# Patient Record
Sex: Male | Born: 1975 | Race: Black or African American | Hispanic: No | Marital: Married | Smoking: Current some day smoker
Health system: Southern US, Community
[De-identification: ages and names within clinical notes are randomized; demographics above are authoritative.]

## PROBLEM LIST (undated history)

## (undated) DIAGNOSIS — M25473 Effusion, unspecified ankle: Secondary | ICD-10-CM

## (undated) DIAGNOSIS — K219 Gastro-esophageal reflux disease without esophagitis: Secondary | ICD-10-CM

## (undated) DIAGNOSIS — M5441 Lumbago with sciatica, right side: Secondary | ICD-10-CM

## (undated) DIAGNOSIS — F4310 Post-traumatic stress disorder, unspecified: Secondary | ICD-10-CM

## (undated) DIAGNOSIS — G8929 Other chronic pain: Secondary | ICD-10-CM

## (undated) HISTORY — PX: VASECTOMY: SHX75

## (undated) HISTORY — DX: Post-traumatic stress disorder, unspecified: F43.10

## (undated) HISTORY — DX: Lumbago with sciatica, right side: M54.41

## (undated) HISTORY — DX: Gastro-esophageal reflux disease without esophagitis: K21.9

## (undated) HISTORY — DX: Other chronic pain: G89.29

## (undated) HISTORY — DX: Effusion, unspecified ankle: M25.473

---

## 2008-02-07 ENCOUNTER — Ambulatory Visit: Payer: Self-pay | Admitting: Family Medicine

## 2008-10-29 HISTORY — PX: VASECTOMY: SHX75

## 2009-01-10 ENCOUNTER — Ambulatory Visit: Payer: Self-pay | Admitting: Internal Medicine

## 2010-03-01 ENCOUNTER — Ambulatory Visit: Payer: Self-pay | Admitting: Internal Medicine

## 2015-08-08 ENCOUNTER — Ambulatory Visit: Payer: Self-pay | Admitting: Family Medicine

## 2015-08-22 ENCOUNTER — Encounter: Payer: Self-pay | Admitting: Family Medicine

## 2015-08-22 ENCOUNTER — Ambulatory Visit (INDEPENDENT_AMBULATORY_CARE_PROVIDER_SITE_OTHER): Payer: BLUE CROSS/BLUE SHIELD | Admitting: Family Medicine

## 2015-08-22 VITALS — BP 138/88 | HR 106 | Temp 98.2°F | Resp 18 | Ht 72.0 in | Wt 256.6 lb

## 2015-08-22 DIAGNOSIS — L301 Dyshidrosis [pompholyx]: Secondary | ICD-10-CM

## 2015-08-22 MED ORDER — TRIAMCINOLONE ACETONIDE 0.5 % EX OINT: 1.0000 "application " | TOPICAL_OINTMENT | Freq: Two times a day (BID) | CUTANEOUS | Status: DC

## 2015-08-22 NOTE — Progress Notes (Signed)
Name: Dustin RumpleLarry D Karaffa Lawson.   MRN: [id number]    DOB: 10/14/1976   Date:08/22/2015       Progress Note  Subjective  Chief Complaint  Chief Complaint  Patient presents with  . New Patient (Initial Visit)    here to est care   Rash This is a chronic problem. The problem has been waxing and waning since onset. The affected locations include the chest, torso, back and right arm. The rash is characterized by dryness and scaling. He was exposed to nothing. Pertinent negatives include no fever. Past treatments include topical steroids. His past medical history is significant for eczema (hx of eczema diagnosed in 1996).   Past Medical History  Diagnosis Date  . GERD (gastroesophageal reflux disease)   . Post traumatic stress disorder (PTSD)     [facility] ([facility]  . Chronic low back pain with right-sided sciatica   . Ankle swelling     R > L.    Past Surgical History  Procedure Laterality Date  . Vasectomy    . Vasectomy  2010    Family History  Problem Relation Age of Onset  . Hypertension Father   . Healthy Father   . Healthy Mother   . Healthy Sister   . Healthy Brother     Social History   Social History  . Marital Status: Married    Spouse Name: N/A  . Number of Children: N/A  . Years of Education: N/A   Occupational History  . Not on file.   Social History Main Topics  . Smoking status: Current Every Day Smoker    Types: Cigars  . Smokeless tobacco: Not on file  . Alcohol Use: 28.8 oz/week    0 Standard drinks or equivalent, 48 Cans of beer per week  . Drug Use: No  . Sexual Activity: No   Other Topics Concern  . Not on file   Social History Narrative  . No narrative on file    No current outpatient prescriptions on file.  No Known Allergies   Review of Systems  Constitutional: Negative for fever, chills, weight loss and malaise/fatigue.  Skin: Positive for rash.   Objective  Filed Vitals:   08/22/15 1547  BP: 138/88   Pulse: 106  Temp: 98.2 F (36.8 C)  Resp: 18  Height: 6' (1.829 m)  Weight: 256 lb 9 oz (116.376 kg)  SpO2: 95%    Physical Exam  Constitutional: He is well-developed, well-nourished, and in no distress.  Skin: Skin is warm.  Macular, diffuse, pruritic, scaly lesions on the chest, upper back and right arm,  Nursing note and vitals reviewed.    Assessment & Plan  1. Eczema, dyshidrotic  - triamcinolone ointment (KENALOG) 0.5 %; Apply 1 application topically 2 (two) times daily.  Dispense: 45 g; Refill: 0   Isobelle Tuckett Asad A. Faylene KurtzShah [facility] 08/22/2015 4:02 PM

## 2016-02-21 ENCOUNTER — Ambulatory Visit (INDEPENDENT_AMBULATORY_CARE_PROVIDER_SITE_OTHER): Payer: Managed Care, Other (non HMO) | Admitting: Family Medicine

## 2016-02-21 ENCOUNTER — Encounter: Payer: Self-pay | Admitting: Family Medicine

## 2016-02-21 VITALS — BP 132/69 | HR 91 | Temp 98.3°F | Resp 18 | Ht 72.0 in | Wt 252.2 lb

## 2016-02-21 DIAGNOSIS — Z720 Tobacco use: Secondary | ICD-10-CM

## 2016-02-21 DIAGNOSIS — E669 Obesity, unspecified: Secondary | ICD-10-CM | POA: Diagnosis not present

## 2016-02-21 DIAGNOSIS — E66811 Obesity, class 1: Secondary | ICD-10-CM

## 2016-02-21 DIAGNOSIS — L301 Dyshidrosis [pompholyx]: Secondary | ICD-10-CM | POA: Diagnosis not present

## 2016-02-21 MED ORDER — TRIAMCINOLONE ACETONIDE 0.5 % EX OINT: 1.0000 "application " | TOPICAL_OINTMENT | Freq: Two times a day (BID) | CUTANEOUS | Status: DC

## 2016-02-21 NOTE — Progress Notes (Signed)
Name: Dustin RumpleLarry D Tecson Jr.   MRN: [id number]    DOB: 1975/11/23   Date:02/21/2016       Progress Note  Subjective  Chief Complaint  Chief Complaint  Patient presents with  . Follow-up    6 mo Eczema    HPI  Eczema: Pt. Is here for refill of Triamcinolone ointment 0.5% to be applied for rash described as eczema. Rash is usually present over his arms, back, inner thighs. He uses it for a week and it clears up. Has been seen by Dermatology 5-6 years ago and was diagnosed with eczema.   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 Diet consists of mainly grilled, or baked foods (fried foods once a week). Eats a lot of green vegetables, and fruit.   Tobacco Abuse: Pt. Smokes a 1/3PPD, has been smoking for over 15 years. He is ready to quit and has used nicotine patch in the past, but complains that it keeps falling off. [facility] the [facility].    Past Medical History  Diagnosis Date  . GERD (gastroesophageal reflux disease)   . Post traumatic stress disorder (PTSD)     [facility] ([facility])  . Chronic low back pain with right-sided sciatica   . Ankle swelling     R > L.    Past Surgical History  Procedure Laterality Date  . Vasectomy    . Vasectomy  2010    Family History  Problem Relation Age of Onset  . Hypertension Father   . Healthy Father   . Healthy Mother   . Healthy Sister   . Healthy Brother     Social History   Social History  . Marital Status: Married    Spouse Name: N/A  . Number of Children: N/A  . Years of Education: N/A   Occupational History  . Not on file.   Social History Main Topics  . Smoking status: Current Every Day Smoker    Types: Cigars  . Smokeless tobacco: Not on file  . Alcohol Use: 28.8 oz/week    0 Standard drinks or equivalent, 48 Cans of beer per week  . Drug Use: No  . Sexual Activity: No   Other  Topics Concern  . Not on file   Social History Narrative     Current outpatient prescriptions:  .  triamcinolone ointment (KENALOG) 0.5 %, Apply 1 application topically 2 (two) times daily., Disp: 45 g, Rfl: 0  No Known Allergies   Review of Systems  Skin: Positive for itching and rash.    Objective  Filed Vitals:   02/21/16 0817  BP: 132/69  Pulse: 91  Temp: 98.3 F (36.8 C)  TempSrc: Oral  Resp: 18  Height: 6' (1.829 m)  Weight: 252 lb 3.2 oz (114.397 kg)  SpO2: 98%    Physical Exam  Constitutional: He is oriented to person, place, and time and well-developed, well-nourished, and in no distress.  Neurological: He is alert and oriented to person, place, and time.  Skin: Lesion and rash noted. Rash is macular.  Macular scaly pruritic rash over the arms, upper back, and neck.  Nursing note and vitals reviewed.     Assessment & Plan  1. Eczema, dyshidrotic Symptoms suggestive of eczema however, recommended that he consult dermatology for further assessment. Refill for Triamcinolone is sent to pharmacy - triamcinolone ointment (KENALOG)  0.5 %; Apply 1 application topically 2 (two) times daily.  Dispense: 45 g; Refill: 0  2. Obesity (BMI 30.0-34.9) Encouraged patient to continue with dietary and lifestyle therapy, follow-up in 3-6 months. [facility]  3. Tobacco abuse Patient is on nicotine patches recommended by the physician at the [place]. Discussed alternative pharmacotherapy such as Chantix versus Zyban. We'll follow-up in 3-6 months.    Lonzo Saulter Asad A. Faylene Kurtz [facility] 02/21/2016 8:19 AM

## 2016-04-04 ENCOUNTER — Other Ambulatory Visit: Payer: Self-pay | Admitting: Family

## 2016-04-04 DIAGNOSIS — R51 Headache: Principal | ICD-10-CM

## 2016-04-04 DIAGNOSIS — R519 Headache, unspecified: Secondary | ICD-10-CM

## 2016-04-04 DIAGNOSIS — T1590XA Foreign body on external eye, part unspecified, unspecified eye, initial encounter: Secondary | ICD-10-CM

## 2016-04-17 ENCOUNTER — Ambulatory Visit
Admission: RE | Admit: 2016-04-17 | Discharge: 2016-04-17 | Disposition: A | Discharge status: Home / Self Care | Payer: No Typology Code available for payment source | Source: Ambulatory Visit | Attending: Family | Admitting: Family

## 2016-04-17 DIAGNOSIS — T1590XA Foreign body on external eye, part unspecified, unspecified eye, initial encounter: Secondary | ICD-10-CM

## 2016-04-17 DIAGNOSIS — R51 Headache: Principal | ICD-10-CM

## 2016-04-17 DIAGNOSIS — R519 Headache, unspecified: Secondary | ICD-10-CM

## 2016-08-22 ENCOUNTER — Encounter: Payer: Self-pay | Admitting: Family Medicine

## 2016-08-22 ENCOUNTER — Ambulatory Visit (INDEPENDENT_AMBULATORY_CARE_PROVIDER_SITE_OTHER): Payer: Managed Care, Other (non HMO) | Admitting: Family Medicine

## 2016-08-22 VITALS — BP 132/71 | HR 97 | Temp 98.1°F | Resp 17 | Ht 71.0 in | Wt 251.3 lb

## 2016-08-22 DIAGNOSIS — K219 Gastro-esophageal reflux disease without esophagitis: Secondary | ICD-10-CM

## 2016-08-22 DIAGNOSIS — L219 Seborrheic dermatitis, unspecified: Secondary | ICD-10-CM

## 2016-08-22 MED ORDER — OMEPRAZOLE 20 MG PO CPDR: 20.0000 mg | DELAYED_RELEASE_CAPSULE | Freq: Every day | ORAL | 0 refills | Status: DC

## 2016-08-22 MED ORDER — KETOCONAZOLE 2 % EX SHAM: MEDICATED_SHAMPOO | CUTANEOUS | 2 refills | Status: AC

## 2016-08-22 NOTE — Progress Notes (Signed)
Name: Dustin RumpleLarry D Coulston Jr.   MRN: [id number]    DOB: 1976/03/27   Date:08/22/2016       Progress Note  Subjective  Chief Complaint  Chief Complaint  Patient presents with  . Follow-up    6 mo    Gastroesophageal Reflux  He complains of abdominal pain (occasional epigastric abdominal pain), coughing, heartburn and nausea. He reports no sore throat. This is a chronic problem. The problem occurs frequently. The problem has been gradually worsening. The symptoms are aggravated by medications (After [facility], noticed his reflux got worse.). Pertinent negatives include no anemia, fatigue, melena or weight loss. He has tried an antacid and a histamine-2 antagonist for the symptoms. Past procedures do not include an abdominal ultrasound or an EGD.    Pt. Presents for refill on Nizoral shampoo, [facility] for a fungal rash on the scalp and body. Reports having sores and itching on the head and the body from constant itching and scratching.  He is using it three times a day as prescribed by Dermatology and is running out of the shampoo and requesting refill. Rash is improving since he was started on the shampoo. Next appointment with Dermatology is not until 3 months,  records are not available.   Past Medical History:  Diagnosis Date  . Ankle swelling    R > L.  Marland Kitchen. Chronic low back pain with right-sided sciatica   . GERD (gastroesophageal reflux disease)   . Post traumatic stress disorder (PTSD)    [facility] 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 VASECTOMY    . VASECTOMY  2010    Family History  Problem Relation Age of Onset  . Hypertension Father   . Healthy Father   . Healthy Mother   . Healthy Sister   . Healthy Brother     Social History   Social History  . Marital status: Married    Spouse name: N/A  . Number of children: N/A  . Years of education: N/A    Occupational History  . Not on file.   Social History Main Topics  . Smoking status: Current Every Day Smoker    Types: Cigars  . Smokeless tobacco: Never Used  . Alcohol use 28.8 oz/week    48 Cans of beer per week  . Drug use: No  . Sexual activity: No   Other Topics Concern  . Not on file   Social History Narrative  . No narrative on file     Current Outpatient Prescriptions:  .  amitriptyline (ELAVIL) 25 MG tablet, Take 25 mg by mouth at bedtime., Disp: , Rfl:  .  buPROPion (WELLBUTRIN) 100 MG tablet, Take 100 mg by mouth 2 (two) times daily., Disp: , Rfl:  .  fluticasone (FLONASE) 50 MCG/ACT nasal spray, Place into both nostrils daily., Disp: , Rfl:  .  ketoconazole (NIZORAL) 2 % cream, Apply 1 application topically daily., Disp: , Rfl:  .  ketoconazole (NIZORAL) 2 % shampoo, Apply 1 application topically 2 (two) times a week., Disp: , Rfl:  .  loratadine (CLARITIN) 10 MG tablet, Take 10 mg by mouth daily., Disp: , Rfl:  .  Menthol-Methyl Salicylate (THERA-GESIC PLUS) CREA, Apply topically., Disp: , Rfl:  .  naproxen (NAPROSYN) 500 MG tablet, Take 500 mg by mouth 2 (two) times daily with a meal., Disp: , Rfl:  .  omeprazole (PRILOSEC) 20 MG capsule, Take 20 mg by mouth daily., Disp: , Rfl:  .  SUMAtriptan (IMITREX) 50 MG tablet, Take 50 mg by mouth every 2 (two) hours as needed for migraine. May repeat in 2 hours if headache persists or recurs., Disp: , Rfl:  .  terbinafine (LAMISIL) 1 % cream, Apply 1 application topically 2 (two) times daily., Disp: , Rfl:  .  triamcinolone ointment (KENALOG) 0.5 %, Apply 1 application topically 2 (two) times daily., Disp: 45 g, Rfl: 0 .  nicotine (NICODERM CQ - DOSED IN MG/24 HOURS) 14 mg/24hr patch, Place 14 mg onto the skin daily., Disp: , Rfl:   No Known Allergies   Review of Systems  Constitutional: Negative for fatigue and weight loss.  HENT: Negative for sore throat.   Respiratory: Positive for cough.   Gastrointestinal:  Positive for abdominal pain (occasional epigastric abdominal pain), heartburn and nausea. Negative for melena.  Skin: Positive for rash.    Objective  Vitals:   08/22/16 0919  BP: 132/71  Pulse: 97  Resp: 17  Temp: 98.1 F (36.7 C)  TempSrc: Oral  SpO2: 97%  Weight: 251 lb 4.8 oz (114 kg)  Height: 5\' 11"  (1.803 m)    Physical Exam  Constitutional: He is well-developed, well-nourished, and in no distress.  HENT:  Head: Normocephalic and atraumatic.  Cardiovascular: Normal rate, regular rhythm, S1 normal, S2 normal and normal heart sounds.   No murmur heard. Pulmonary/Chest: Effort normal and breath sounds normal. He has no wheezes. He has no rhonchi.  Abdominal: Soft. Bowel sounds are normal. There is no tenderness.  Skin: Skin is warm, dry and intact. No rash noted.  No rash on the upper back or scalp, pt. Reports that rash is clearing up.  Nursing note and vitals reviewed.    Assessment & Plan  1. Seborrheic dermatitis of scalp Prescription for Nizoral, patient will try to bring records from the [place] - ketoconazole (NIZORAL) 2 % shampoo; Use in shower as body wash and on scalp.  Dispense: 120 mL; Refill: 2  2. Gastroesophageal reflux disease, esophagitis presence not specified Symptoms of reflux, start on omeprazole 20 mg, reassess in 3 months - omeprazole (PRILOSEC) 20 MG capsule; Take 1 capsule (20 mg total) by mouth daily.  Dispense: 90 capsule; Refill: 0   Syed Asad A. Faylene Kurtz [facility] 08/22/2016 9:40 AM

## 2016-08-24 ENCOUNTER — Other Ambulatory Visit: Payer: Self-pay | Admitting: Family Medicine

## 2016-08-24 NOTE — Telephone Encounter (Signed)
[facility] [place] [facility] supply refill on Omeprezole 20 mg to be sent in for patient.

## 2016-08-28 NOTE — Telephone Encounter (Signed)
Medication has been refilled and sent to [facility] [place] on 08/22/2016

## 2016-09-26 ENCOUNTER — Encounter: Payer: Managed Care, Other (non HMO) | Admitting: Family Medicine

## 2016-10-04 ENCOUNTER — Encounter: Payer: Self-pay | Admitting: Family Medicine

## 2016-10-04 ENCOUNTER — Ambulatory Visit (INDEPENDENT_AMBULATORY_CARE_PROVIDER_SITE_OTHER): Payer: Managed Care, Other (non HMO) | Admitting: Family Medicine

## 2016-10-04 DIAGNOSIS — Z0000 Encounter for general adult medical examination without abnormal findings: Secondary | ICD-10-CM | POA: Insufficient documentation

## 2016-10-04 LAB — CBC WITH DIFFERENTIAL/PLATELET
BASOS PCT: 0 %
Basophils Absolute: 0 cells/uL (ref 0–200)
EOS ABS: 61 {cells}/uL (ref 15–500)
Eosinophils Relative: 1 %
HEMATOCRIT: 42.6 % (ref 38.5–50.0)
Hemoglobin: 14.1 g/dL (ref 13.2–17.1)
LYMPHS PCT: 43 %
Lymphs Abs: 2623 cells/uL (ref 850–3900)
MCH: 25 pg — ABNORMAL LOW (ref 27.0–33.0)
MCHC: 33.1 g/dL (ref 32.0–36.0)
MCV: 75.7 fL — AB (ref 80.0–100.0)
MONO ABS: 488 {cells}/uL (ref 200–950)
MONOS PCT: 8 %
MPV: 10.8 fL (ref 7.5–12.5)
NEUTROS PCT: 48 %
Neutro Abs: 2928 cells/uL (ref 1500–7800)
PLATELETS: 193 10*3/uL (ref 140–400)
RBC: 5.63 MIL/uL (ref 4.20–5.80)
RDW: 15.2 % — AB (ref 11.0–15.0)
WBC: 6.1 10*3/uL (ref 3.8–10.8)

## 2016-10-04 LAB — COMPLETE METABOLIC PANEL WITH GFR
ALT: 22 U/L (ref 9–46)
AST: 21 U/L (ref 10–40)
Albumin: 4.3 g/dL (ref 3.6–5.1)
Alkaline Phosphatase: 48 U/L (ref 40–115)
BUN: 10 mg/dL (ref 7–25)
CALCIUM: 9.2 mg/dL (ref 8.6–10.3)
CHLORIDE: 106 mmol/L (ref 98–110)
CO2: 28 mmol/L (ref 20–31)
Creat: 1.17 mg/dL (ref 0.60–1.35)
GFR, EST NON AFRICAN AMERICAN: 78 mL/min (ref >=60)
Glucose, Bld: 93 mg/dL (ref 65–99)
POTASSIUM: 4.4 mmol/L (ref 3.5–5.3)
Sodium: 139 mmol/L (ref 135–146)
Total Bilirubin: 0.4 mg/dL (ref 0.2–1.2)
Total Protein: 6.9 g/dL (ref 6.1–8.1)

## 2016-10-04 LAB — LIPID PANEL
CHOL/HDL RATIO: 2.8 ratio (ref <5.0)
CHOLESTEROL: 166 mg/dL (ref <200)
HDL: 60 mg/dL (ref >40)
LDL CALC: 78 mg/dL (ref <100)
TRIGLYCERIDES: 140 mg/dL (ref <150)
VLDL: 28 mg/dL (ref <30)

## 2016-10-04 LAB — TSH: TSH: 0.8 mIU/L (ref 0.40–4.50)

## 2016-10-04 LAB — PSA: PSA: 0.4 ng/mL (ref <=4.0)

## 2016-10-04 NOTE — Progress Notes (Signed)
Name: Dustin RumpleLarry D Tippetts Jr.   MRN: [id number]    DOB: 1975-11-14   Date:10/04/2016       Progress Note  Subjective  Chief Complaint  Chief Complaint  Patient presents with  . Annual Exam    CPE    HPI  Pt. Presents for a Complete Physical Exam. He is doing well.   Past Medical History:  Diagnosis Date  . Ankle swelling    R > L.  Dustin Lawson. Chronic low back pain with right-sided sciatica   . GERD (gastroesophageal reflux disease)   . Post traumatic stress disorder (PTSD)    [facility] 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 VASECTOMY    . VASECTOMY  2010    Family History  Problem Relation Age of Onset  . Hypertension Father   . Healthy Father   . Healthy Mother   . Healthy Sister   . Healthy Brother     Social History   Social History  . Marital status: Married    Spouse name: N/A  . Number of children: N/A  . Years of education: N/A   Occupational History  . Not on file.   Social History Main Topics  . Smoking status: Current Every Day Smoker    Types: Cigars  . Smokeless tobacco: Never Used  . Alcohol use 28.8 oz/week    48 Cans of beer per week  . Drug use: No  . Sexual activity: No   Other Topics Concern  . Not on file   Social History Narrative  . No narrative on file     Current Outpatient Prescriptions:  .  amitriptyline (ELAVIL) 25 MG tablet, Take 25 mg by mouth at bedtime., Disp: , Rfl:  .  buPROPion (WELLBUTRIN) 100 MG tablet, Take 100 mg by mouth 2 (two) times daily., Disp: , Rfl:  .  fluticasone (FLONASE) 50 MCG/ACT nasal spray, Place into both nostrils daily., Disp: , Rfl:  .  ketoconazole (NIZORAL) 2 % cream, Apply 1 application topically daily., Disp: , Rfl:  .  ketoconazole (NIZORAL) 2 % shampoo, Use in shower as body wash and on scalp., Disp: 120 mL, Rfl: 2 .  loratadine (CLARITIN) 10 MG tablet, Take 10 mg by mouth daily., Disp: , Rfl:  .  Menthol-Methyl Salicylate (THERA-GESIC PLUS)  CREA, Apply topically., Disp: , Rfl:  .  naproxen (NAPROSYN) 500 MG tablet, Take 500 mg by mouth 2 (two) times daily with a meal., Disp: , Rfl:  .  omeprazole (PRILOSEC) 20 MG capsule, Take 1 capsule (20 mg total) by mouth daily., Disp: 90 capsule, Rfl: 0 .  SUMAtriptan (IMITREX) 50 MG tablet, Take 50 mg by mouth every 2 (two) hours as needed for migraine. May repeat in 2 hours if headache persists or recurs., Disp: , Rfl:  .  terbinafine (LAMISIL) 1 % cream, Apply 1 application topically 2 (two) times daily., Disp: , Rfl:  .  triamcinolone ointment (KENALOG) 0.5 %, Apply 1 application topically 2 (two) times daily., Disp: 45 g, Rfl: 0 .  nicotine (NICODERM CQ - DOSED IN MG/24 HOURS) 14 mg/24hr patch, Place 14 mg onto the skin daily., Disp: , Rfl:   No Known Allergies   Review of Systems  Constitutional: Negative for chills, fever, malaise/fatigue and weight loss.  HENT: Negative for congestion and sore throat.   Eyes: Negative for blurred vision and double vision.  Respiratory: Negative for cough, sputum production and shortness of  breath.   Cardiovascular: Negative for chest pain and leg swelling.  Gastrointestinal: Negative for abdominal pain, blood in stool, melena, nausea and vomiting.  Genitourinary: Negative for dysuria and hematuria.  Neurological: Positive for headaches (migraine/tension headaches).  Psychiatric/Behavioral: Positive for depression (has been diagnosed with PTSD). The patient is nervous/anxious. The patient does not have insomnia.      Objective  Vitals:   10/04/16 1101  BP: 134/76  Pulse: 97  Resp: 16  Temp: 98.8 F (37.1 C)  TempSrc: Oral  SpO2: 96%  Weight: 249 lb 6.4 oz (113.1 kg)  Height: 5\' 11"  (1.803 m)    Physical Exam  Constitutional: He is oriented to person, place, and time and well-developed, well-nourished, and in no distress.  HENT:  Head: Normocephalic and atraumatic.  Cardiovascular: Normal rate, regular rhythm and normal heart  sounds.   No murmur heard. Pulmonary/Chest: Effort normal and breath sounds normal. He has no wheezes.  Abdominal: Bowel sounds are normal. There is no tenderness.  Neurological: He is alert and oriented to person, place, and time.  Psychiatric: Mood, memory, affect and judgment normal.  Nursing note and vitals reviewed.    Assessment & Plan  1. Annual physical exam  - CBC with Differential - COMPLETE METABOLIC PANEL WITH GFR - Lipid Profile - TSH - Vitamin D (25 hydroxy) - PSA - POCT HgB A1C   Dustin Lawson A. Dustin Lawson [facility] 10/04/2016 11:03 AM

## 2016-10-05 ENCOUNTER — Telehealth: Payer: Self-pay

## 2016-10-05 LAB — POCT GLYCOSYLATED HEMOGLOBIN (HGB A1C): HEMOGLOBIN A1C: 5

## 2016-10-05 LAB — VITAMIN D 25 HYDROXY (VIT D DEFICIENCY, FRACTURES): Vit D, 25-Hydroxy: 10 ng/mL — ABNORMAL LOW (ref 30–100)

## 2016-10-05 MED ORDER — VITAMIN D (ERGOCALCIFEROL) 1.25 MG (50000 UNIT) PO CAPS: 50000.0000 [IU] | ORAL_CAPSULE | ORAL | 0 refills | Status: DC

## 2016-10-05 NOTE — Telephone Encounter (Signed)
Patient has been notified of lab results and a prescription of vitamin D3 50,000 units take 1 capsule once a week x12 weeks has been sent to [facility] [place] per Dr. Sherryll BurgerShah, patient has been notified and verbalized understanding

## 2017-01-17 ENCOUNTER — Other Ambulatory Visit: Payer: Self-pay | Admitting: Family Medicine

## 2017-05-30 IMAGING — CR DG ORBITS FOR FOREIGN BODY
2 series · 2 of 2 positions shown · non-contrast
Comparison: None.

CLINICAL DATA: Metal working/exposure; clearance prior to MRI

EXAM:
ORBITS FOR FOREIGN BODY - 2 VIEW

[t skull ap (1 of 2)]
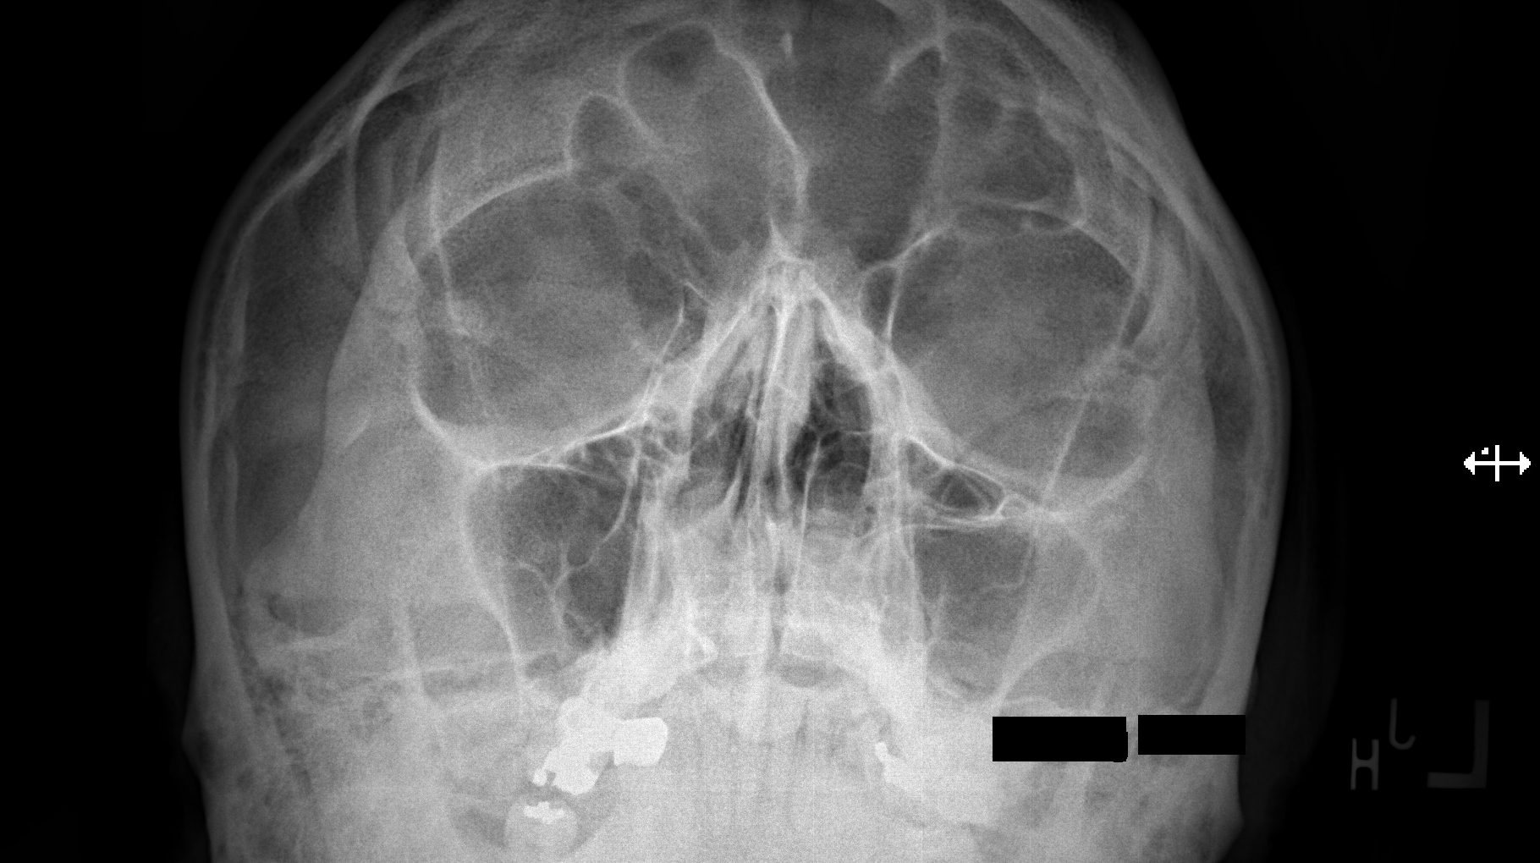

[t skull ap (2 of 2)]
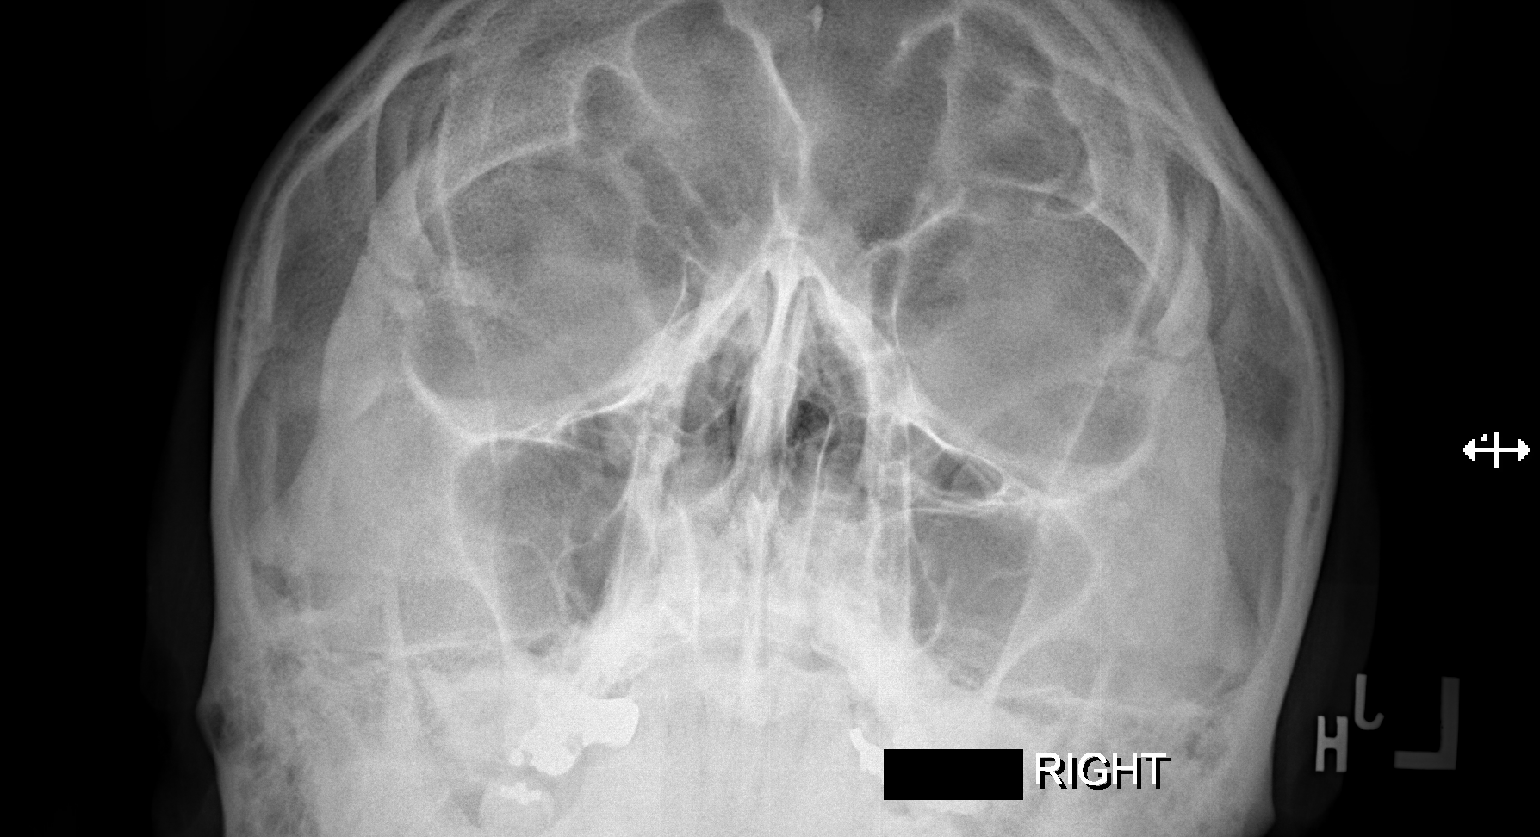

[2 of 2 positions shown; findings below may reference images not displayed]

FINDINGS: There is no evidence of metallic foreign body within the orbits. No
significant bone abnormality identified.
IMPRESSION: No evidence of metallic foreign body within the orbits.

## 2017-10-08 ENCOUNTER — Encounter: Payer: Managed Care, Other (non HMO) | Admitting: Family Medicine

## 2017-11-12 ENCOUNTER — Ambulatory Visit (INDEPENDENT_AMBULATORY_CARE_PROVIDER_SITE_OTHER): Payer: Managed Care, Other (non HMO)

## 2017-11-12 ENCOUNTER — Ambulatory Visit
Admission: EM | Admit: 2017-11-12 | Discharge: 2017-11-12 | Disposition: A | Discharge status: Home / Self Care | Payer: Managed Care, Other (non HMO) | Attending: Family Medicine | Admitting: Family Medicine

## 2017-11-12 ENCOUNTER — Other Ambulatory Visit: Payer: Self-pay

## 2017-11-12 DIAGNOSIS — Z23 Encounter for immunization: Secondary | ICD-10-CM | POA: Diagnosis not present

## 2017-11-12 DIAGNOSIS — S60452A Superficial foreign body of right middle finger, initial encounter: Secondary | ICD-10-CM

## 2017-11-12 MED ORDER — TETANUS-DIPHTH-ACELL PERTUSSIS 5-2.5-18.5 LF-MCG/0.5 IM SUSP
0.5000 mL | Freq: Once | INTRAMUSCULAR | Status: AC
  Administered 2017-11-12: 0.5 mL via INTRAMUSCULAR

## 2017-11-12 NOTE — ED Provider Notes (Signed)
[facility]    CSN: [id number] Arrival date & time: 11/12/17  1635     History   Chief Complaint Chief Complaint  Patient presents with  . Foreign Body in Skin    HPI Dustin RumpleLarry D Norgren Jr. is a 42 y.o. male.   HPI  42 year old male who presents with a foreign body at the tip of his right dominant middle finger.  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  His distal tip.  He pulled something out but feels like there remains a portion of the foreign body in his finger.  Not current on his tetanus toxoid.        Past Medical History:  Diagnosis Date  . Ankle swelling    R > L.  Marland Kitchen. Chronic low back pain with right-sided sciatica   . GERD (gastroesophageal reflux disease)   . Post traumatic stress disorder (PTSD)    [facility] 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 Annual physical exam 10/04/2016  . Seborrheic dermatitis of scalp 08/22/2016  . Obesity (BMI 30.0-34.9) 02/21/2016  . Tobacco abuse 02/21/2016  . Eczema, dyshidrotic 08/22/2015    Past Surgical History:  Procedure Laterality Date  . VASECTOMY    . VASECTOMY  2010       Home Medications    Prior to Admission medications   Medication Sig Start Date End Date Taking? Authorizing Provider  amitriptyline (ELAVIL) 25 MG tablet Take 25 mg by mouth at bedtime.    [provider]  buPROPion (WELLBUTRIN) 100 MG tablet Take 100 mg by mouth 2 (two) times daily.    [provider]  fluticasone (FLONASE) 50 MCG/ACT nasal spray Place into both nostrils daily.    [provider]  ketoconazole (NIZORAL) 2 % cream Apply 1 application topically daily.    [provider]  ketoconazole (NIZORAL) 2 % shampoo Use in shower as body wash and on scalp. 08/22/16   Ellyn HackShah, Syed Asad A, MD  loratadine (CLARITIN) 10 MG tablet Take 10 mg by mouth daily.    [provider]  Menthol-Methyl  Salicylate (THERA-GESIC PLUS) CREA Apply topically.    [provider]  naproxen (NAPROSYN) 500 MG tablet Take 500 mg by mouth 2 (two) times daily with a meal.    [provider]  omeprazole (PRILOSEC) 20 MG capsule Take 1 capsule (20 mg total) by mouth daily. 08/22/16   Ellyn HackShah, Syed Asad A, MD  SUMAtriptan (IMITREX) 50 MG tablet Take 50 mg by mouth every 2 (two) hours as needed for migraine. May repeat in 2 hours if headache persists or recurs.    [provider]  terbinafine (LAMISIL) 1 % cream Apply 1 application topically 2 (two) times daily.    [provider]  triamcinolone ointment (KENALOG) 0.5 % Apply 1 application topically 2 (two) times daily. 02/21/16   Ellyn HackShah, Syed Asad A, MD    Family History Family History  Problem Relation Age of Onset  . Hypertension Father   . Healthy Father   . Healthy Mother   . Healthy Sister   . Healthy Brother     Social History Social History   Tobacco Use  . Smoking status: Former Smoker    Types: Cigars  . Smokeless tobacco: Never Used  . Tobacco comment: Quit New Years Day  Substance Use Topics  . Alcohol use: Yes  Alcohol/week: 28.8 oz    Types: 48 Cans of beer per week  . Drug use: No     Allergies   Patient has no known allergies.   Review of Systems Review of Systems  Constitutional: Negative for activity change, chills, fatigue and fever.  Skin: Positive for wound.  All other systems reviewed and are negative.    Physical Exam Triage Vital Signs ED Triage Vitals  Enc Vitals Group     BP 11/12/17 1656 120/74     Pulse Rate 11/12/17 1656 78     Resp 11/12/17 1656 18     Temp 11/12/17 1656 98.2 F (36.8 C)     Temp Source 11/12/17 1656 Oral     SpO2 11/12/17 1656 99 %     Weight 11/12/17 1658 238 lb (108 kg)     Height 11/12/17 1658 5\' 11"  (1.803 m)     Head Circumference --      Peak Flow --      Pain Score 11/12/17 1656 2     Pain Loc --      Pain Edu? --      Excl. in GC?  --    No data found.  Updated Vital Signs BP 120/74 (BP Location: Right Arm)   Pulse 78   Temp 98.2 F (36.8 C) (Oral)   Resp 18   Ht 5\' 11"  (1.803 m)   Wt 238 lb (108 kg)   SpO2 99%   BMI 33.19 kg/m   Visual Acuity Right Eye Distance:   Left Eye Distance:   Bilateral Distance:    Right Eye Near:   Left Eye Near:    Bilateral Near:     Physical Exam  Constitutional: He is oriented to person, place, and time. He appears well-developed and well-nourished. No distress.  HENT:  Head: Normocephalic and atraumatic.  Eyes: Pupils are equal, round, and reactive to light. Right eye exhibits no discharge. Left eye exhibits no discharge.  Neck: Normal range of motion.  Musculoskeletal: Normal range of motion. He exhibits no edema or tenderness.  Neurological: He is alert and oriented to person, place, and time.  Skin: Skin is warm and dry. He is not diaphoretic.  Examination of the right dominant middle finger tip shows a small puncture wound just volar to the nail.  No drainage is present.  No erythema is present.  Psychiatric: He has a normal mood and affect. His behavior is normal. Judgment and thought content normal.  Nursing note and vitals reviewed.    UC Treatments / Results  Labs (all labs ordered are listed, but only abnormal results are displayed) Labs Reviewed - No data to display  EKG  EKG Interpretation None       Radiology Dg Finger Middle Right  Result Date: 11/12/2017 CLINICAL DATA:  Possible foreign body, splinter, pain EXAM: RIGHT MIDDLE FINGER 2+V COMPARISON:  None. FINDINGS: There is no evidence of fracture or dislocation. There is no evidence of arthropathy or other focal bone abnormality. Soft tissues are unremarkable. IMPRESSION: Negative. Electronically Signed   By: Jasmine Pang M.D.   On: 11/12/2017 18:20    Procedures Procedures (including critical care time)  Medications Ordered in UC Medications  Tdap (BOOSTRIX) injection 0.5 mL (0.5  mLs Intramuscular Given 11/12/17 1705)     Initial Impression / Assessment and Plan / UC Course  I have reviewed the triage vital signs and the nursing notes.  Pertinent labs & imaging results that were available during  my care of the patient were reviewed by me and considered in my medical decision making (see chart for details).     Plan: 1. Test/x-ray results and diagnosis reviewed with patient 2. rx as per orders; risks, benefits, potential side effects reviewed with patient 3. Recommend supportive treatment with the area clean.  Discussed signs and symptoms of infection.  He will return to the clinic if any these occur.  [facility] do not expect him to have any problems and if there is any retained foreign body it will eventually work its way to the surface. 4. F/u prn if symptoms worsen or don't improve   Final Clinical Impressions(s) / UC Diagnoses   Final diagnoses:  Foreign body of right middle finger    ED Discharge Orders    None       Controlled Substance Prescriptions [place] Controlled Substance Registry consulted? Not Applicable   Lutricia Feil, PA-C 11/12/17 1610

## 2017-11-12 NOTE — ED Triage Notes (Signed)
Pt with "something" in the tip of his middle finger right hand. "It'd either a splinter of wood or a splinter of metal."  Pain 2/10

## 2017-11-15 ENCOUNTER — Telehealth: Payer: Self-pay | Admitting: *Deleted

## 2017-11-15 NOTE — Telephone Encounter (Signed)
Called patient, patient reported feeling better. Advised patient to follow up with PCP or [facility] if signs of infection occur.

## 2018-12-25 IMAGING — CR DG FINGER MIDDLE 2+V*R*
3 series · 3 of 3 positions shown · non-contrast
Comparison: None.

CLINICAL DATA: Possible foreign body, splinter, pain

EXAM:
RIGHT MIDDLE FINGER 2+V

[finger ap]
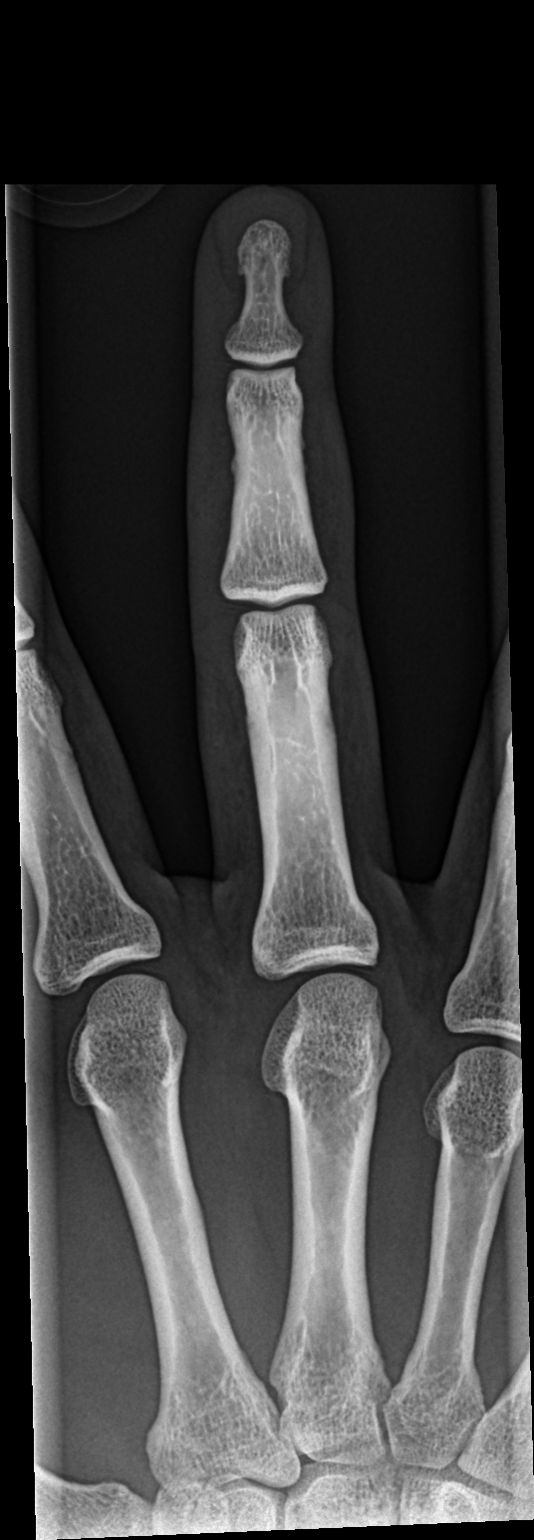

[finger obl]
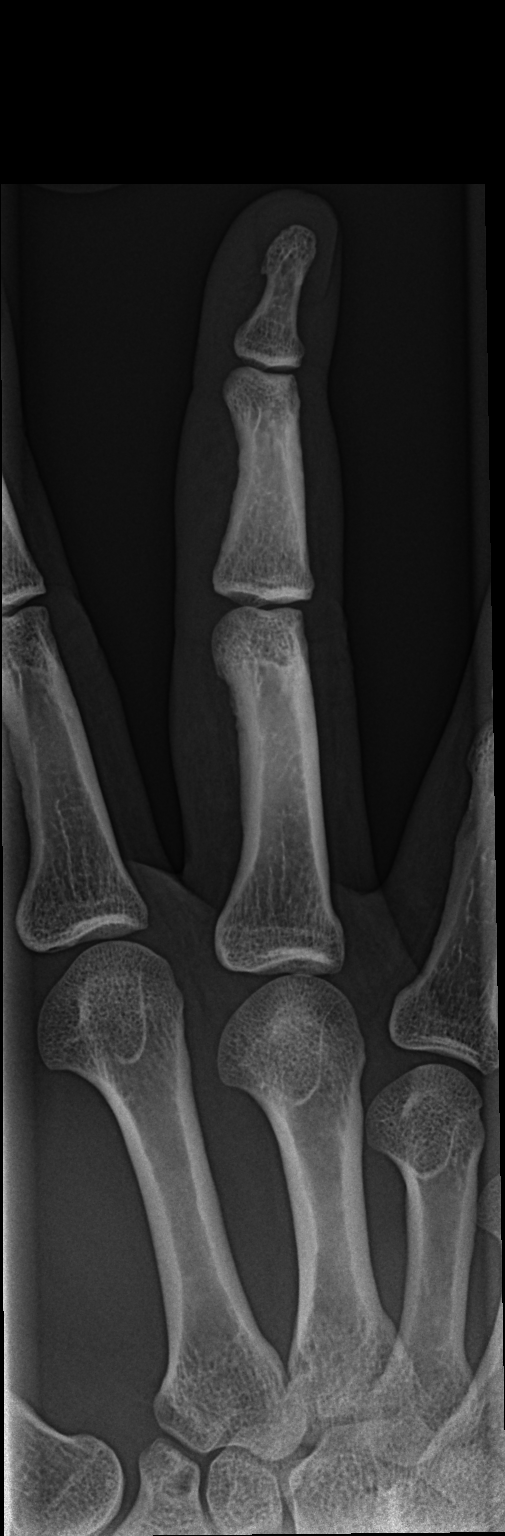

[finger lat]
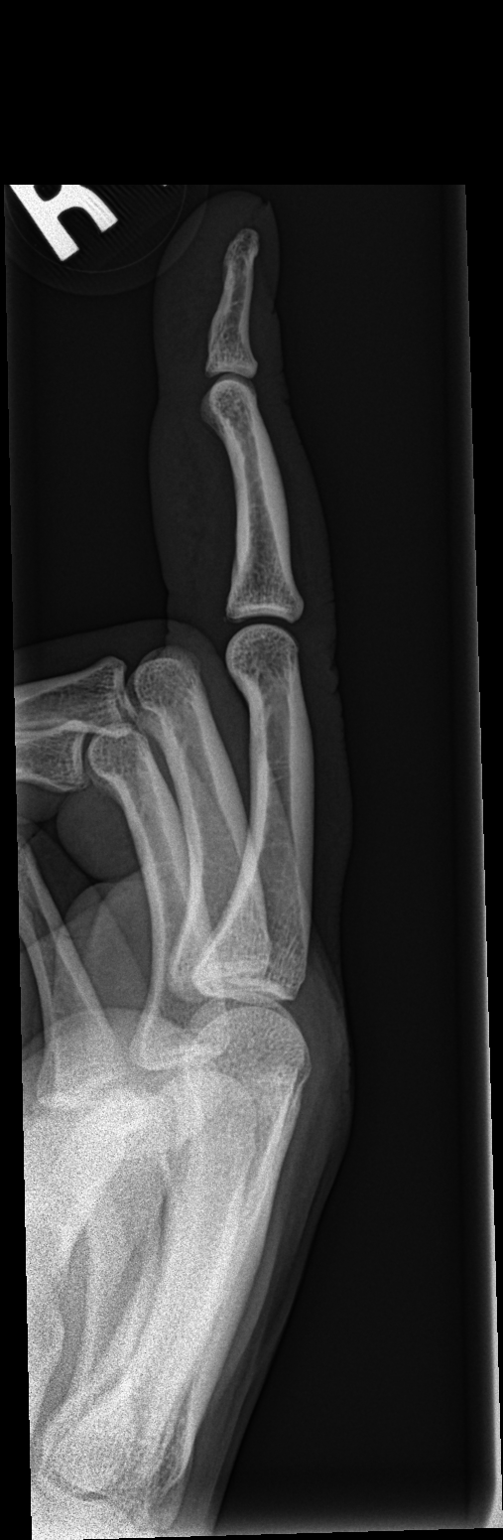

[3 of 3 positions shown; findings below may reference images not displayed]

FINDINGS: There is no evidence of fracture or dislocation. There is no
evidence of arthropathy or other focal bone abnormality. Soft
tissues are unremarkable.
IMPRESSION: Negative.

## 2019-11-11 ENCOUNTER — Ambulatory Visit: Payer: Managed Care, Other (non HMO) | Attending: Internal Medicine

## 2019-11-11 DIAGNOSIS — Z20822 Contact with and (suspected) exposure to covid-19: Secondary | ICD-10-CM

## 2019-11-12 LAB — NOVEL CORONAVIRUS, NAA: SARS-CoV-2, NAA: NOT DETECTED

## 2022-11-22 ENCOUNTER — Encounter: Payer: Self-pay | Admitting: Psychiatry

## 2022-11-22 ENCOUNTER — Other Ambulatory Visit
Admission: RE | Admit: 2022-11-22 | Discharge: 2022-11-22 | Disposition: A | Discharge status: Home / Self Care | Payer: No Typology Code available for payment source | Attending: Psychiatry | Admitting: Psychiatry

## 2022-11-22 ENCOUNTER — Ambulatory Visit: Payer: No Typology Code available for payment source | Admitting: Psychiatry

## 2022-11-22 VITALS — BP 140/90 | HR 83 | Temp 98.7°F | Ht 71.0 in | Wt 245.4 lb

## 2022-11-22 DIAGNOSIS — F4310 Post-traumatic stress disorder, unspecified: Secondary | ICD-10-CM | POA: Diagnosis not present

## 2022-11-22 DIAGNOSIS — R519 Headache, unspecified: Secondary | ICD-10-CM

## 2022-11-22 DIAGNOSIS — F32A Depression, unspecified: Secondary | ICD-10-CM

## 2022-11-22 MED ORDER — HYDROXYZINE HCL 25 MG PO TABS: 12.5000 mg | ORAL_TABLET | Freq: Two times a day (BID) | ORAL | 1 refills | Status: DC | PRN

## 2022-11-22 MED ORDER — BUPROPION HCL ER (XL) 150 MG PO TB24: 150.0000 mg | ORAL_TABLET | Freq: Every day | ORAL | 1 refills | Status: DC
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
## 2022-11-22 NOTE — Patient Instructions (Signed)
Hydroxyzine Capsules or Tablets What is this medication? HYDROXYZINE (hye DROX i zeen) treats the symptoms of allergies and allergic reactions. It may also be used to treat anxiety or cause drowsiness before a procedure. It works by blocking histamine, a substance released by the body during an allergic reaction. It belongs to a group of medications called antihistamines. This medicine may be used for other purposes; ask your health care provider or pharmacist if you have questions. COMMON BRAND NAME(S): ANX, Atarax, Rezine, Vistaril What should I tell my care team before I take this medication? They need to know if you have any of these conditions: Glaucoma Heart disease Irregular heartbeat or rhythm Kidney disease Liver disease Lung or breathing disease, such as asthma Stomach or intestine problems Thyroid disease Trouble passing urine An unusual or allergic reaction to hydroxyzine, other medications, foods, dyes or preservatives Pregnant or trying to get pregnant Breastfeeding How should I use this medication? Take this medication by mouth with a full glass of water. Take it as directed on the prescription label at the same time every day. You can take it with or without food. If it upsets your stomach, take it with food. Talk to your care team about the use of this medication in children. While it may be prescribed for children as young as 6 years for selected conditions, precautions do apply. People 65 years and older may have a stronger reaction and need a smaller dose. Overdosage: If you think you have taken too much of this medicine contact a poison control center or emergency room at once. NOTE: This medicine is only for you. Do not share this medicine with others. What if I miss a dose? If you miss a dose, take it as soon as you can. If it is almost time for your next dose, take only that dose. Do not take double or extra doses. What may interact with this medication? Do not  take this medication with any of the following: Cisapride Dronedarone Pimozide Thioridazine This medication may also interact with the following: Alcohol Antihistamines for allergy, cough, and cold Atropine Barbiturate medications for sleep or seizures, such as phenobarbital Certain antibiotics, such as erythromycin or clarithromycin Certain medications for anxiety or sleep Certain medications for bladder problems, such as oxybutynin or tolterodine Certain medications for irregular heartbeat Certain medications for mental health conditions Certain medications for Parkinson disease, such as benztropine, trihexyphenidyl Certain medications for seizures, such as phenobarbital or primidone Certain medications for stomach problems, such as dicyclomine or hyoscyamine Certain medications for travel sickness, such as scopolamine Ipratropium Opioid medications for pain Other medications that cause heart rhythm changes, such as dofetilide This list may not describe all possible interactions. Give your health care provider a list of all the medicines, herbs, non-prescription drugs, or dietary supplements you use. Also tell them if you smoke, drink alcohol, or use illegal drugs. Some items may interact with your medicine. What should I watch for while using this medication? Visit your care team for regular checks on your progress. Tell your care team if your symptoms do not start to get better or if they get worse. This medication may affect your coordination, reaction time, or judgment. Do not drive or operate machinery until you know how this medication affects you. Sit up or stand slowly to reduce the risk of dizzy or fainting spells. Drinking alcohol with this medication can increase the risk of these side effects. Your mouth may get dry. Chewing sugarless gum or sucking hard candy   and drinking plenty of water may help. Contact your care team if the problem does not go away or is severe. This  medication may cause dry eyes and blurred vision. If you wear contact lenses, you may feel some discomfort. Lubricating eye drops may help. See your care team if the problem does not go away or is severe. If you are receiving skin tests for allergies, tell your care team you are taking this medication. What side effects may I notice from receiving this medication? Side effects that you should report to your care team as soon as possible: Allergic reactions--skin rash, itching, hives, swelling of the face, lips, tongue, or throat Heart rhythm changes--fast or irregular heartbeat, dizziness, feeling faint or lightheaded, chest pain, trouble breathing Side effects that usually do not require medical attention (report to your care team if they continue or are bothersome): Confusion Drowsiness Dry mouth Hallucinations Headache This list may not describe all possible side effects. Call your doctor for medical advice about side effects. You may report side effects to FDA at 1-800-FDA-1088. Where should I keep my medication? Keep out of the reach of children and pets. Store at room temperature between 15 and 30 degrees C (59 and 86 degrees F). Keep container tightly closed. Throw away any unused medication after the expiration date. NOTE: This sheet is a summary. It may not cover all possible information. If you have questions about this medicine, talk to your doctor, pharmacist, or health care provider.  2023 Elsevier/Gold Standard (2007-12-06 00:00:00)  

## 2022-11-23 LAB — URINE DRUGS OF ABUSE SCREEN W ALC, ROUTINE (REF LAB)
Amphetamines, Urine: NEGATIVE ng/mL
Barbiturate, Ur: NEGATIVE ng/mL
Benzodiazepine Quant, Ur: NEGATIVE ng/mL
Cannabinoid Quant, Ur: NEGATIVE ng/mL
Cocaine (Metab.): NEGATIVE ng/mL
Ethanol U, Quan: NEGATIVE %
Methadone Screen, Urine: NEGATIVE ng/mL
Opiate Quant, Ur: NEGATIVE ng/mL
Phencyclidine, Ur: NEGATIVE ng/mL
Propoxyphene, Urine: NEGATIVE ng/mL

## 2023-01-14 ENCOUNTER — Ambulatory Visit (INDEPENDENT_AMBULATORY_CARE_PROVIDER_SITE_OTHER): Payer: No Typology Code available for payment source | Admitting: Psychiatry

## 2023-01-14 ENCOUNTER — Ambulatory Visit: Payer: Managed Care, Other (non HMO) | Admitting: Psychiatry

## 2023-01-14 ENCOUNTER — Encounter: Payer: Self-pay | Admitting: Psychiatry

## 2023-01-14 VITALS — BP 134/84 | HR 80 | Temp 98.7°F | Ht 71.0 in | Wt 247.2 lb

## 2023-01-14 DIAGNOSIS — F4310 Post-traumatic stress disorder, unspecified: Secondary | ICD-10-CM

## 2023-01-14 DIAGNOSIS — F330 Major depressive disorder, recurrent, mild: Secondary | ICD-10-CM

## 2023-01-14 MED ORDER — BUPROPION HCL ER (XL) 150 MG PO TB24: 150.0000 mg | ORAL_TABLET | Freq: Every day | ORAL | 2 refills | Status: DC

## 2023-01-14 NOTE — Progress Notes (Signed)
[facility] MD OP Progress Note  01/14/2023 12:47 PM Dustin Lawson.  MRN:  [id number]  Chief Complaint:  Chief Complaint  Patient presents with   Follow-up   Depression   Post-Traumatic Stress Disorder   Medication Refill   HPI: Dustin Lawsonis a 47 year old African-American male, veteran, currently employed, married, lives in [place], has a history of PTSD, depression, anxiety was evaluated in office today.  Patient today reports he is currently compliant on the Wellbutrin XL 150 mg.  Has noticed a good difference with regards to mood.  There are days that he does feel down, anxious, although improving.  Denies side effects to Wellbutrin.  Reports sleep has improved since being on the hydroxyzine along with the amitriptyline.  Gets around 6 hours of sleep.  Currently also trying to cut back on alcohol intake towards bedtime.  Patient has not been able to find a psychotherapist, currently awaiting to hear back from the [place].  Patient denies any suicidality, homicidality or perceptual disturbances.  Continues to have headaches, was supposed to have neurology visit today, however reports due to scheduling conflict had to call them to reschedule it.  Currently awaiting the same.  Denies any other concerns today.    Visit Diagnosis:    ICD-10-CM   1. PTSD (post-traumatic stress disorder)  F43.10 buPROPion (WELLBUTRIN XL) 150 MG 24 hr tablet    2. MDD (major depressive disorder), recurrent episode, mild (HCC)  F33.0 buPROPion (WELLBUTRIN XL) 150 MG 24 hr tablet      Past Psychiatric History: Reviewed past psychiatric history from progress note on 11/22/2022.  Past trials of Wellbutrin, amitriptyline-for headaches  Past Medical History:  Past Medical History:  Diagnosis Date   Ankle swelling    R > L.   Chronic low back pain with right-sided sciatica    GERD (gastroesophageal reflux disease)    Post traumatic stress disorder (PTSD)    Seen at the [facility]. (Former  Nature conservation officer)    Past Surgical History:  Procedure Laterality Date   VASECTOMY     VASECTOMY  2010    Family Psychiatric History: Reviewed family psychiatric history from progress note on 11/22/2022.  Family History:  Family History  Problem Relation Age of Onset   Healthy Mother    Hypertension Father    Healthy Father    Depression Sister    Healthy Sister    Depression Brother    Healthy Brother    Depression Maternal Aunt     Social History: Reviewed social history from progress note on 11/22/2022. Social History   Socioeconomic History   Marital status: Married    Spouse name: Not on file   Number of children: 3   Years of education: Not on file   Highest education level: Associate degree: academic program  Occupational History   Not on file  Tobacco Use   Smoking status: Some Days    Types: Cigars   Smokeless tobacco: Never   Tobacco comments:    Quit New Years Day  Vaping Use   Vaping Use: Never used  Substance and Sexual Activity   Alcohol use: Yes    Alcohol/week: 10.0 standard drinks of alcohol    Types: 10 Shots of liquor per week   Drug use: No   Sexual activity: Yes    Birth control/protection: None  Other Topics Concern   Not on file  Social History Narrative   Not on file   Social Determinants of Health  Financial Resource Strain: Not on file  Food Insecurity: Not on file  Transportation Needs: Not on file  Physical Activity: Not on file  Stress: Not on file  Social Connections: Not on file    Allergies: No Known Allergies  Metabolic Disorder Labs: Lab Results  Component Value Date   HGBA1C 5.0 10/05/2016   No results found for: "PROLACTIN" Lab Results  Component Value Date   CHOL 166 10/04/2016   TRIG 140 10/04/2016   HDL 60 10/04/2016   CHOLHDL 2.8 10/04/2016   VLDL 28 10/04/2016   LDLCALC 78 10/04/2016   Lab Results  Component Value Date   TSH 0.80 10/04/2016    Therapeutic Level Labs: No results found for:  "LITHIUM" No results found for: "VALPROATE" No results found for: "CBMZ"  Current Medications: Current Outpatient Medications  Medication Sig Dispense Refill   amitriptyline (ELAVIL) 25 MG tablet Take 25 mg by mouth at bedtime.     fluticasone (FLONASE) 50 MCG/ACT nasal spray Place into both nostrils daily.     hydrOXYzine (ATARAX) 25 MG tablet Take 0.5-1 tablets (12.5-25 mg total) by mouth 2 (two) times daily as needed for anxiety. And sleep ( could use at bedtime as needed for sleep) 60 tablet 1   ketoconazole (NIZORAL) 2 % cream Apply 1 application topically daily.     ketoconazole (NIZORAL) 2 % shampoo Use in shower as body wash and on scalp. 120 mL 2   loratadine (CLARITIN) 10 MG tablet Take 10 mg by mouth daily.     Menthol-Methyl Salicylate (THERA-GESIC PLUS) CREA Apply topically.     SUMAtriptan (IMITREX) 50 MG tablet Take 50 mg by mouth every 2 (two) hours as needed for migraine. May repeat in 2 hours if headache persists or recurs.     buPROPion (WELLBUTRIN XL) 150 MG 24 hr tablet Take 1 tablet (150 mg total) by mouth daily with breakfast. Stop Bupropion 100 mg tablet 30 tablet 2   No current facility-administered medications for this visit.     Musculoskeletal: Strength & Muscle Tone: within normal limits Gait & Station: normal Patient leans: N/A  Psychiatric Specialty Exam: Review of Systems  Neurological:  Positive for headaches (Chronic).  Psychiatric/Behavioral:  Positive for dysphoric mood and sleep disturbance. The patient is nervous/anxious.   All other systems reviewed and are negative.   Blood pressure 134/84, pulse 80, temperature 98.7 F (37.1 C), temperature source Skin, height 5\' 11"  (1.803 m), weight 247 lb 3.2 oz (112.1 kg).Body mass index is 34.48 kg/m.  General Appearance: Casual  Eye Contact:  Good  Speech:  Clear and Coherent  Volume:  Normal  Mood:   Anxious and depressed-improving  Affect:  Congruent  Thought Process:  Goal Directed and  Descriptions of Associations: Intact  Orientation:  Full (Time, Place, and Person)  Thought Content: Logical   Suicidal Thoughts:  No  Homicidal Thoughts:  No  Memory:  Immediate;   Fair Recent;   Fair Remote;   Fair  Judgement:  Fair  Insight:  Fair  Psychomotor Activity:  Normal  Concentration:  Concentration: Fair and Attention Span: Fair  Recall:  [employer] of Knowledge: Fair  Language: Fair  Akathisia:  No  Handed:  Right  AIMS (if indicated): not done  Assets:  Communication Skills Desire for Improvement Housing Social Support  ADL's:  Intact  Cognition: WNL  Sleep:   Improving   Screenings: GAD-7    Flowsheet Row [facility] Visit [facility]14/2023 in [facility] Visit from 11/22/2022 in [place]  Total GAD-7 Score 14 13      PHQ2-9    [facility] Office Visit from 01/14/2023 in [place] Office Visit from 11/22/2022 in [facility] Office Visit from 10/04/2016 in [facility] Office Visit from 08/22/2016 in [facility] Office Visit from 02/21/2016 in [facility]  PHQ-2 Total Score 4 1 0 0 0  PHQ-9 Total Score 14 10 -- -- --      Flowsheet Row Office Visit from 01/14/2023 in [place] Office Visit from 11/22/2022 in [place] CATEGORY Low Risk Low Risk        Assessment and Plan: Dustin Debold. is a 47 year old African-American male, employed, veteran, has a history of PTSD, presented for medication management.  Patient is currently improving although will benefit from psychotherapy sessions as well as management of headaches, refer to neurology last visit.  Plan as noted below.  Plan  PTSD-improving Continue  Wellbutrin XL 150 mg p.o. daily in the morning Patient is currently on amitriptyline 25 mg at bedtime for headaches, has been referred for neurology consultation. Will consider adding an SSRI or SNRI in the future as needed.  However will have to taper off amitriptyline prior to adding more psychotropics due to concerns of drug to drug interactions. Patient advised to establish care with therapist-[facility].  MDD mild-improving Referred for CBT Continue Wellbutrin XL 150 mg p.o. daily Continue Amitriptyline 25 mg at bedtime-prescribed for headaches. Continue hydroxyzine 12.5-25 mg p.o. twice daily as needed for mood, sleep.  Reviewed and discussed labs-urine drug screen-11/22/2022-negative.  Pending labs from previous provider.  Follow-up in clinic in 2 to 3 months or sooner if needed.    Collaboration of Care: Collaboration of Care: Referral or follow-up with counselor/therapist AEB encouraged to establish care with therapist-currently awaiting to hear back from the [place] and Other patient referred for neurology consultation-awaiting the same-will coordinate care.  Patient/Guardian was advised Release of Information must be obtained prior to any record release in order to collaborate their care with an outside provider. Patient/Guardian was advised if they have not already done so to contact the registration department to sign all necessary forms in order for [place] to release information regarding their care.   Consent: Patient/Guardian gives verbal consent for treatment and assignment of benefits for services provided during this visit. Patient/Guardian expressed understanding and agreed to proceed.   This note was generated in part or whole with voice recognition software. Voice recognition is usually quite accurate but there are transcription errors that can and very often do occur. I apologize for any typographical errors that were not detected and  corrected.    Ursula Alert, MD 01/14/2023, 12:47 PM

## 2023-03-27 ENCOUNTER — Ambulatory Visit: Payer: Self-pay | Admitting: Psychiatry

## 2023-04-02 ENCOUNTER — Ambulatory Visit: Payer: Self-pay | Admitting: Psychiatry

## 2023-05-17 ENCOUNTER — Ambulatory Visit: Payer: Self-pay | Admitting: Psychiatry

## 2023-06-14 ENCOUNTER — Ambulatory Visit: Payer: Self-pay | Admitting: Psychiatry

## 2023-07-11 ENCOUNTER — Ambulatory Visit (INDEPENDENT_AMBULATORY_CARE_PROVIDER_SITE_OTHER): Payer: Self-pay | Admitting: Psychiatry

## 2023-07-11 ENCOUNTER — Encounter: Payer: Self-pay | Admitting: Psychiatry

## 2023-07-11 VITALS — BP 132/80 | HR 80 | Temp 97.8°F | Ht 71.0 in | Wt 260.4 lb

## 2023-07-11 DIAGNOSIS — Z634 Disappearance and death of family member: Secondary | ICD-10-CM

## 2023-07-11 DIAGNOSIS — F4310 Post-traumatic stress disorder, unspecified: Secondary | ICD-10-CM

## 2023-07-11 DIAGNOSIS — F3342 Major depressive disorder, recurrent, in full remission: Secondary | ICD-10-CM

## 2023-07-11 MED ORDER — BUPROPION HCL ER (XL) 150 MG PO TB24: 150.0000 mg | ORAL_TABLET | Freq: Every day | ORAL | 5 refills | Status: AC

## 2023-07-11 MED ORDER — HYDROXYZINE HCL 25 MG PO TABS: 12.5000 mg | ORAL_TABLET | Freq: Two times a day (BID) | ORAL | 5 refills | Status: AC | PRN

## 2023-07-11 NOTE — Progress Notes (Signed)
BH MD  OP Progress Note  07/11/2023 10:35 AM Dustin Lawson.  MRN:  [id number]  Chief Complaint:  Chief Complaint  Patient presents with   Follow-up   Depression   Anxiety   Medication Refill   Grief   HPI: Dustin Lawsonis a 47 year old old African-American male,veteran,, currently employed, married, lives in [place], has a history of PTSD, MDD, bereavement was evaluated in office today.  Patient today reports he just lost his stepfather Saturday.  Patient reports he is currently running around to make arrangements as well as trying to support his mother who is not doing well.  Patient reports that has been stressful dealing with everything as well as family.  That does trigger mood symptoms like anxiety, some restlessness, worrying and being on edge although he is managing okay.  He does report he is grieving the loss of his stepdad.  His stepdad was in his life for around 23 years.  That does make it harder.  Patient reports he does not have a therapist right now however will be willing to establish care.  Patient reports sleep is okay when he takes the hydroxyzine.  However since the past few nights he has not been able to take the medications since he wanted to be alert with everything going on in his life right now and to be there for his mother if she needed him.  Him sleep has been restless.  Patient reports his wife is a Production assistant, radio and she has been helping him to watch his diet and start losing weight.  He hence is trying to do so.  He is currently cutting back on alcohol and drinks socially.  Did not elaborate further.  Patient is compliant on the Wellbutrin.  Denies side effects.  Patient denies any suicidality, homicidality or perceptual disturbances.  Patient denies any other concerns today.  Visit Diagnosis:    ICD-10-CM   1. PTSD (post-traumatic stress disorder)  F43.10 buPROPion (WELLBUTRIN XL) 150 MG 24 hr tablet    hydrOXYzine (ATARAX) 25  MG tablet    2. MDD (major depressive disorder), recurrent, in full remission (HCC)  F33.42     3. Bereavement  Z63.4       Past Psychiatric History: I have reviewed past psychiatric history from progress note on 11/22/2022.  Past trials of Wellbutrin, amitriptyline-for headaches.  Past Medical History:  Past Medical History:  Diagnosis Date   Ankle swelling    R > L.   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  Past trials of Wellbutrin, amitriptyline-for headaches.  Family History:  Family History  Problem Relation Age of Onset   Healthy Mother    Hypertension Father    Healthy Father    Depression Sister    Healthy Sister    Depression Brother    Healthy Brother    Depression Maternal Aunt     Social History: I have reviewed social history from progress note on 11/22/2022. Social History   Socioeconomic History   Marital status: Married    Spouse name: Not on file   Number of children: 3  Years of education: Not on file   Highest education level: Associate degree: academic program  Occupational History   Not on file  Tobacco Use   Smoking status: Some Days    Types: Cigars   Smokeless tobacco: Never   Tobacco comments:    Quit New Years Day  Vaping Use   Vaping status: Never Used  Substance and Sexual Activity   Alcohol use: Yes    Alcohol/week: 10.0 standard drinks of alcohol    Types: 10 Shots of liquor per week   Drug use: No   Sexual activity: Yes    Birth control/protection: None  Other Topics Concern   Not on file  Social History Narrative   Not on file   Social Determinants of Health   Financial Resource Strain: Not  on file  Food Insecurity: Not on file  Transportation Needs: Not on file  Physical Activity: Not on file  Stress: Not on file  Social Connections: Unknown (03/13/2022)   [facility] from [facility]   Social Network    Social Network: Not on file    Allergies: No Known Allergies  Metabolic Disorder Labs: Lab Results  Component Value Date   HGBA1C 5.0 10/05/2016   No results found for: "PROLACTIN" Lab Results  Component Value Date   CHOL 166 10/04/2016   TRIG 140 10/04/2016   HDL 60 10/04/2016   CHOLHDL 2.8 10/04/2016   VLDL 28 10/04/2016   LDLCALC 78 10/04/2016   Lab Results  Component Value Date   TSH 0.80 10/04/2016    Therapeutic Level Labs: No results found for: "LITHIUM" No results found for: "VALPROATE" No results found for: "CBMZ"  Current Medications: Current Outpatient Medications  Medication Sig Dispense Refill   clobetasol ointment (TEMOVATE) 0.05 % Apply topically.     meloxicam (MOBIC) 15 MG tablet TAKE ONE TABLET BY MOUTH ONCE DAILY AFTER A MEAL FOR PAIN AND INFLAMMATION     amitriptyline (ELAVIL) 25 MG tablet Take 25 mg by mouth at bedtime.     buPROPion (WELLBUTRIN XL) 150 MG 24 hr tablet Take 1 tablet (150 mg total) by mouth daily with breakfast. Stop Bupropion 100 mg tablet 30 tablet 5   fluticasone (FLONASE) 50 MCG/ACT nasal spray Place into both nostrils daily.     hydrOXYzine (ATARAX) 25 MG tablet Take 0.5-1 tablets (12.5-25 mg total) by mouth 2 (two) times daily as needed for anxiety. And sleep ( could use at bedtime as needed for sleep) 60 tablet 5   ketoconazole (NIZORAL) 2 % cream Apply 1 application topically daily.     ketoconazole (NIZORAL) 2 % shampoo Use in shower as body wash and on scalp. 120 mL 2   loratadine (CLARITIN) 10 MG tablet Take 10 mg by mouth daily.     Menthol-Methyl Salicylate (THERA-GESIC PLUS) CREA Apply topically.     SUMAtriptan (IMITREX) 50 MG tablet Take 50 mg by mouth every 2 (two) hours as needed for  migraine. May repeat in 2 hours if headache persists or recurs.     No current facility-administered medications for this visit.     Musculoskeletal: Strength & Muscle Tone: within normal limits Gait & Station: normal Patient leans: N/A  Psychiatric Specialty Exam: Review of Systems  Psychiatric/Behavioral:  Positive for sleep disturbance. The patient is nervous/anxious.        Grief    Blood pressure 132/80, pulse 80, temperature 97.8 F (36.6 C), temperature source Skin, height 5\' 11"  (1.803 m), weight 260 lb 6.4  oz (118.1 kg).Body mass index is 36.32 kg/m.  General Appearance: Fairly Groomed  Eye Contact:  Good  Speech:  Clear and Coherent  Volume:  Normal  Mood:  Anxious, grief  Affect:  Appropriate  Thought Process:  Goal Directed and Descriptions of Associations: Intact  Orientation:  Full (Time, Place, and Person)  Thought Content: Logical   Suicidal Thoughts:  No  Homicidal Thoughts:  No  Memory:  Immediate;   Fair Recent;   Fair Remote;   Fair  Judgement:  Fair  Insight:  Fair  Psychomotor Activity:  Normal  Concentration:  Concentration: Fair and Attention Span: Fair  Recall:  [employer] of Knowledge: Fair  Language: Fair  Akathisia:  No  Handed:  Right  AIMS (if indicated): not done  Assets:  Communication Skills Desire for Improvement Housing Intimacy Social Support Talents/Skills Transportation  ADL's:  Intact  Cognition: WNL  Sleep:   restless due to recent events and not being able to take medication as needed   Screenings: GAD-7    Loss adjuster, chartered Office Visit from 07/11/2023 in [facility] Office Visit from 01/14/2023 in [facility] Office Visit from 11/22/2022 in [facility]  Total GAD-7 Score 14 14 13       PHQ2-9    Flowsheet Row Office Visit from 07/11/2023 in [facility]  Visit from 01/14/2023 in [facility] Office Visit from 11/22/2022 in [facility] Office Visit from 10/04/2016 in [facility] Office Visit from 08/22/2016 in [facility]  PHQ-2 Total Score 3 4 1  0 0  PHQ-9 Total Score 10 14 10  -- --      Flowsheet Row Office Visit from 07/11/2023 in [facility] Office Visit from 01/14/2023 in [facility] Office Visit from 11/22/2022 in [facility]  C-SSRS RISK CATEGORY No Risk Low Risk Low Risk        Assessment and Plan: Dustin Lawsonis a 47 year old African-American male, employed, veteran, has a history of PTSD, was evaluated in office today.  Patient currently grieving the loss of his stepfather, will benefit from psychotherapy sessions/grief counseling, continue plan as noted below.  Plan PTSD-improving Continue Wellbutrin XL 150 mg p.o. daily in the morning Will consider starting an SSRI or SNRI in the future as needed however patient is currently on amitriptyline also for headaches and hence I would not recommend adding another medication due to concerns about drug-drug interaction. Patient advised to pursue CBT-provided resources in the community.  MDD in remission Wellbutrin XL 150 mg p.o. daily  Bereavement-unstable Patient advised to establish care with a therapist-provided brief grief counseling. Provided resources in the community.   Follow-up in clinic in 3 to 4 months or sooner if needed.  Collaboration of Care: Collaboration of Care: Referral or follow-up with counselor/therapist AEB patient encouraged to establish care.  Patient/Guardian was advised Release of Information must be obtained prior to any record release in order to collaborate their care with an outside provider.  Patient/Guardian was advised if they have not already done so to contact the registration department to sign all necessary forms in order for [place] to release information regarding their care.   Consent: Patient/Guardian gives verbal consent for treatment and assignment of benefits for services provided during this visit. Patient/Guardian expressed  understanding and agreed to proceed.   This note was generated in part or whole with voice recognition software. Voice recognition is usually quite accurate but there are transcription errors that can and very often do occur. I apologize for any typographical errors that were not detected and corrected.    Jomarie Longs, MD 07/11/2023, 2:52 PM

## 2023-09-03 ENCOUNTER — Telehealth: Payer: Self-pay | Admitting: Psychiatry

## 2023-09-03 NOTE — Telephone Encounter (Signed)
Received FAX from [place] requesting submission of notes, treatment plans.  Okay to submitted however will need an ROI signed by patient.  Please let him sign and send to medical records.

## 2023-11-04 ENCOUNTER — Ambulatory Visit: Payer: Managed Care, Other (non HMO) | Admitting: Psychiatry

## 2023-12-09 ENCOUNTER — Telehealth: Payer: Self-pay | Admitting: Psychiatry

## 2023-12-09 NOTE — Telephone Encounter (Signed)
 Called patient to reschedule the appointment due to provider out of office. States he had this rescheduled several times and will find another provider. He will go through the [place] to do so. Appointment cancelled, not reschedule per his request.

## 2023-12-09 NOTE — Telephone Encounter (Signed)
Sorry to hear 

## 2023-12-19 ENCOUNTER — Ambulatory Visit: Payer: Self-pay | Admitting: Psychiatry
